# Patient Record
Sex: Female | Born: 1975 | Race: White | Hispanic: No | Marital: Single
Health system: Southern US, Community
[De-identification: ages and names within clinical notes are randomized; demographics above are authoritative.]

---

## 1997-11-04 ENCOUNTER — Encounter: Admission: RE | Admit: 1997-11-04 | Discharge: 1998-02-02 | Payer: Self-pay

## 2011-04-20 ENCOUNTER — Other Ambulatory Visit: Payer: Self-pay

## 2011-04-20 ENCOUNTER — Other Ambulatory Visit: Payer: Self-pay | Admitting: Family Medicine

## 2011-04-20 DIAGNOSIS — E049 Nontoxic goiter, unspecified: Secondary | ICD-10-CM

## 2011-05-05 ENCOUNTER — Ambulatory Visit
Admission: RE | Admit: 2011-05-05 | Discharge: 2011-05-05 | Disposition: A | Discharge status: Home / Self Care | Payer: BC Managed Care – PPO | Source: Ambulatory Visit | Attending: Family Medicine | Admitting: Family Medicine

## 2011-05-05 ENCOUNTER — Other Ambulatory Visit: Payer: Self-pay | Admitting: Family Medicine

## 2011-05-05 DIAGNOSIS — E049 Nontoxic goiter, unspecified: Secondary | ICD-10-CM

## 2012-09-13 IMAGING — US US SOFT TISSUE HEAD/NECK
1 series · 14 of 25 positions shown · non-contrast
Comparison: None.

CLINICAL DATA: Enlarged thyroid on physical exam

THYROID ULTRASOUND
TECHNIQUE: Ultrasound examination of the thyroid gland and adjacent
soft tissues was performed.

[Series 1: us soft tissue head/neck · 0.05mm/px · 14 of 50 slices shown]
[im 1/50]
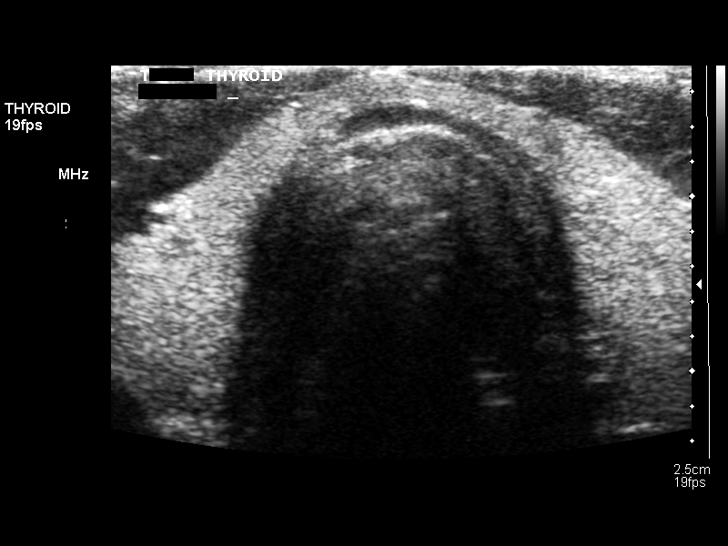
[im 5/50]
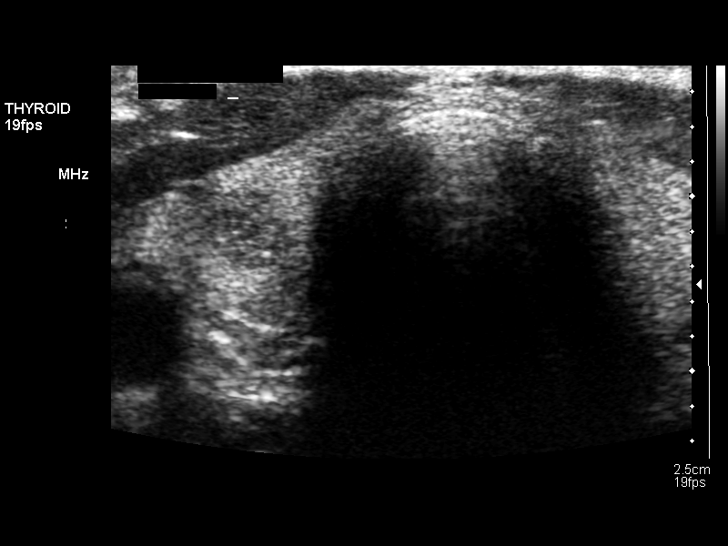
[im 9/50]
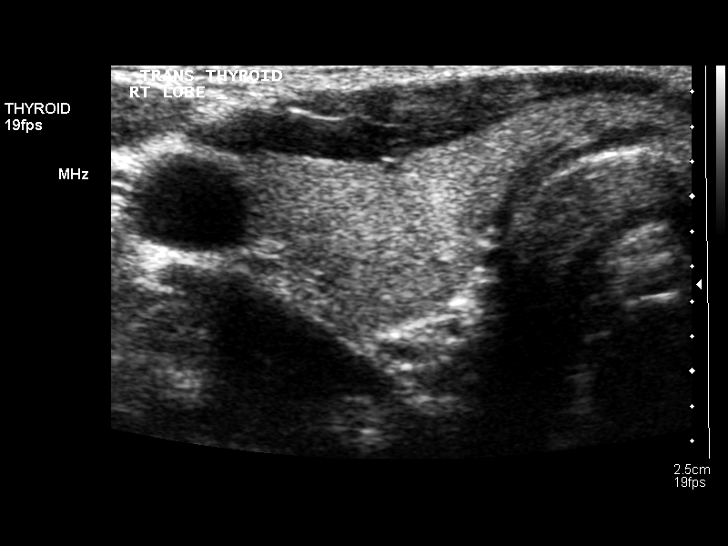
[im 13/50]
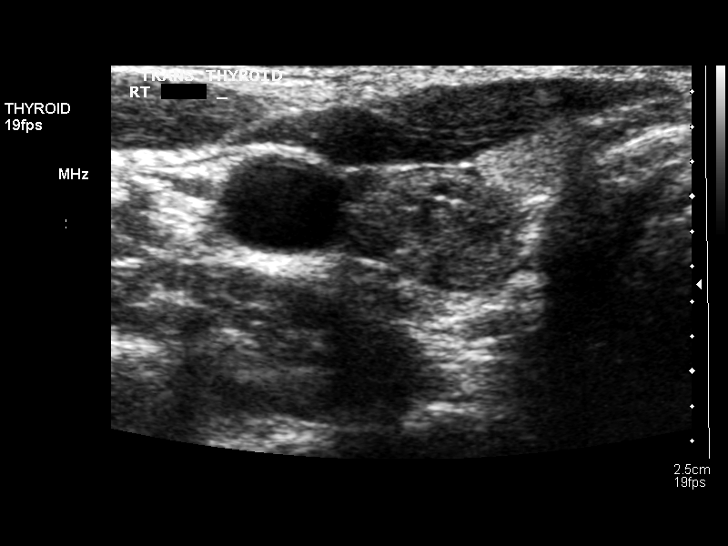
[im 17/50]
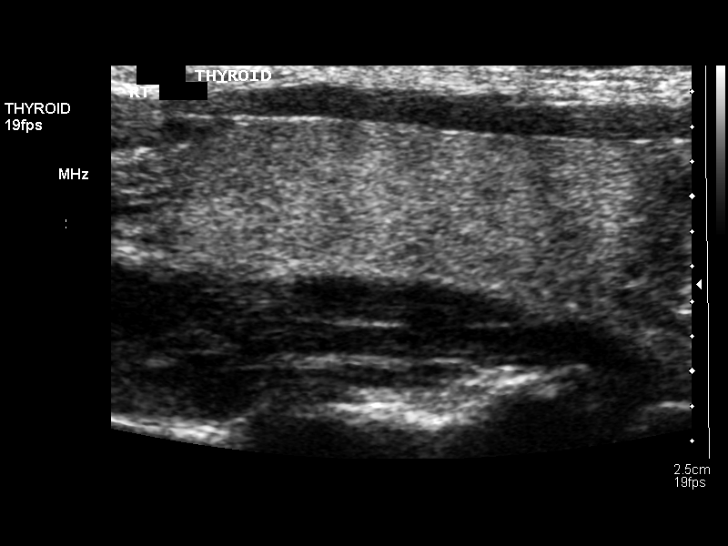
[im 19/50]
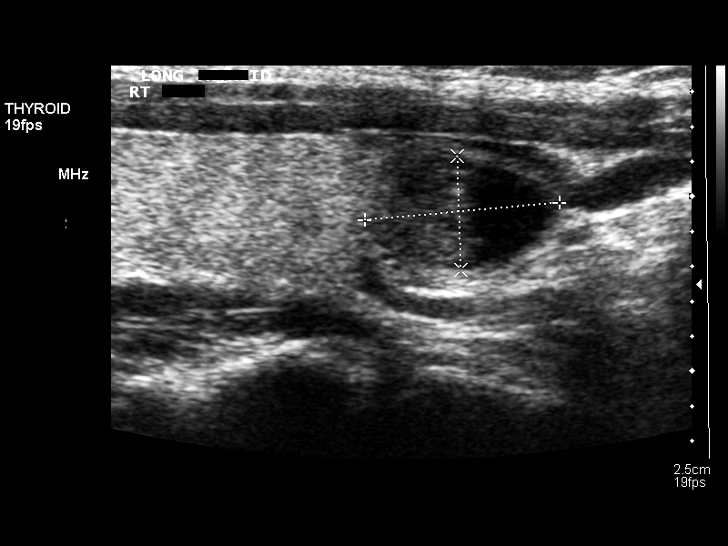
[im 23/50]
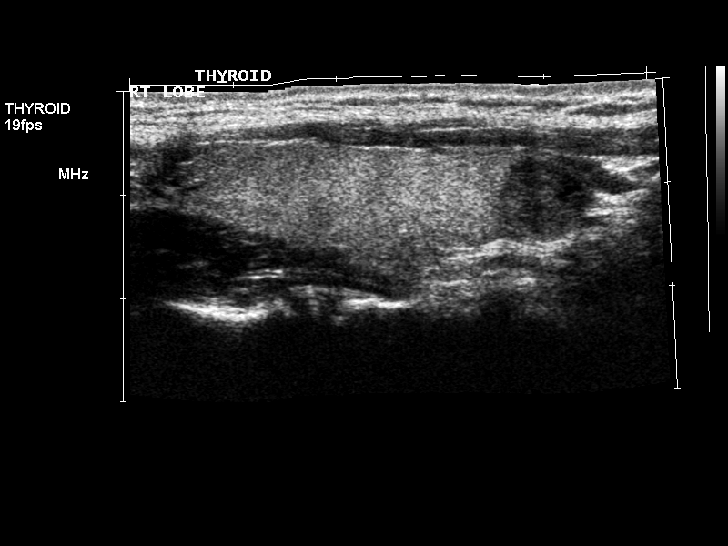
[im 27/50]
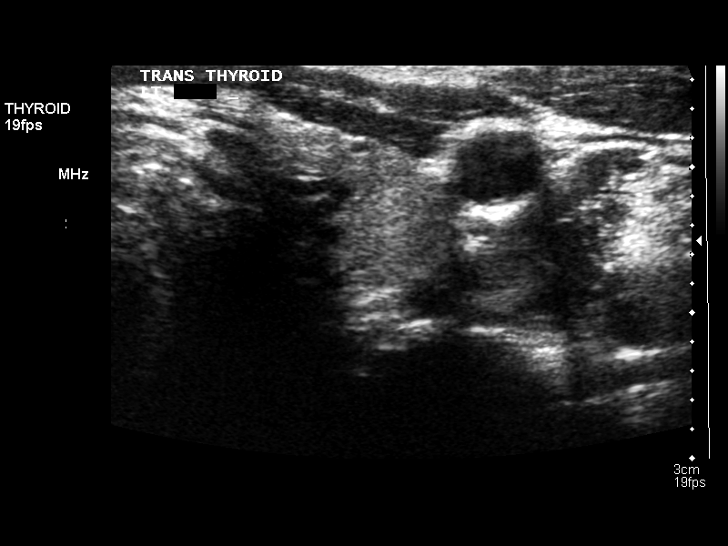
[im 31/50]
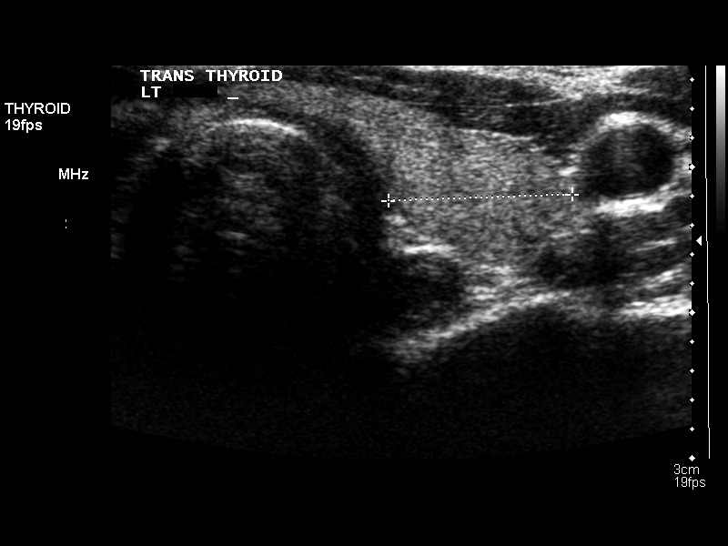
[im 33/50]
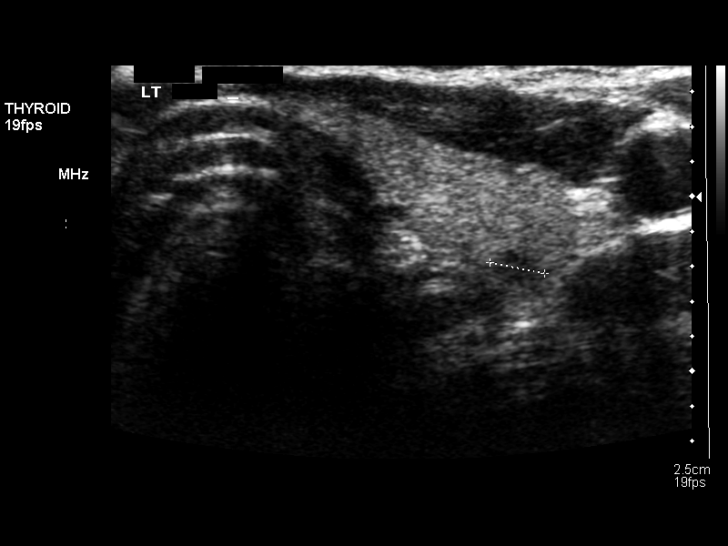
[im 37/50]
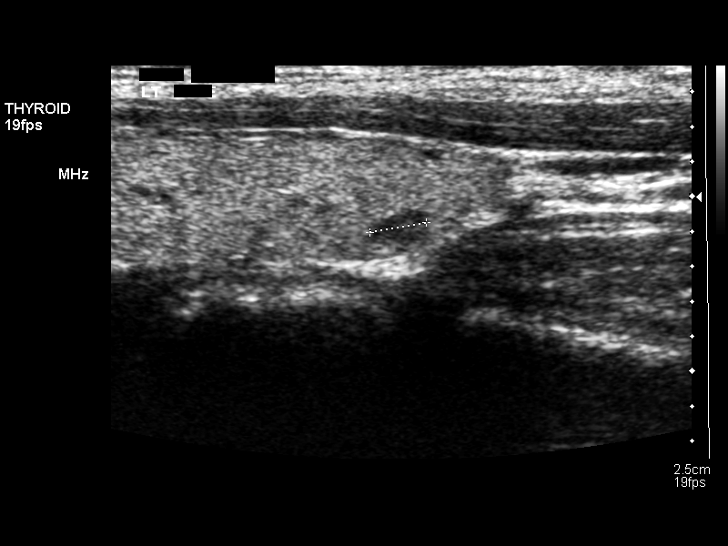
[im 41/50]
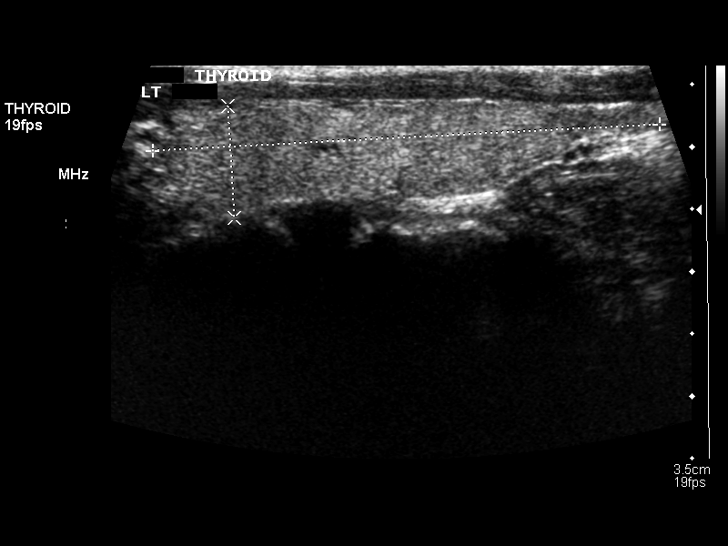
[im 45/50]
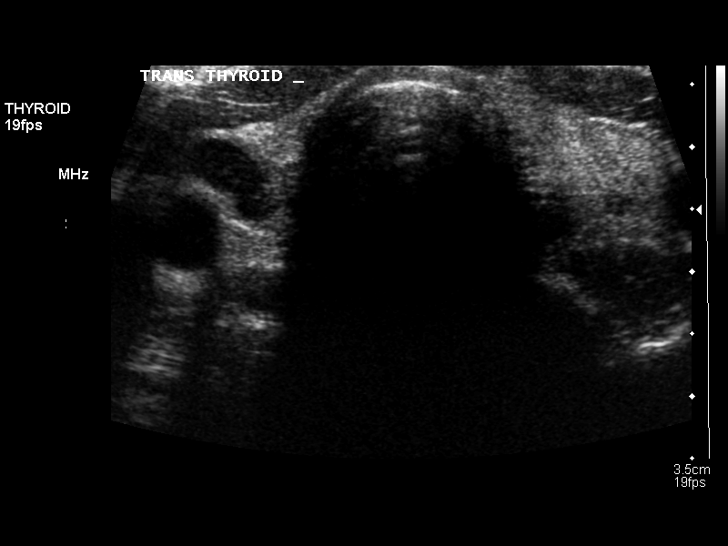
[im 50/50]
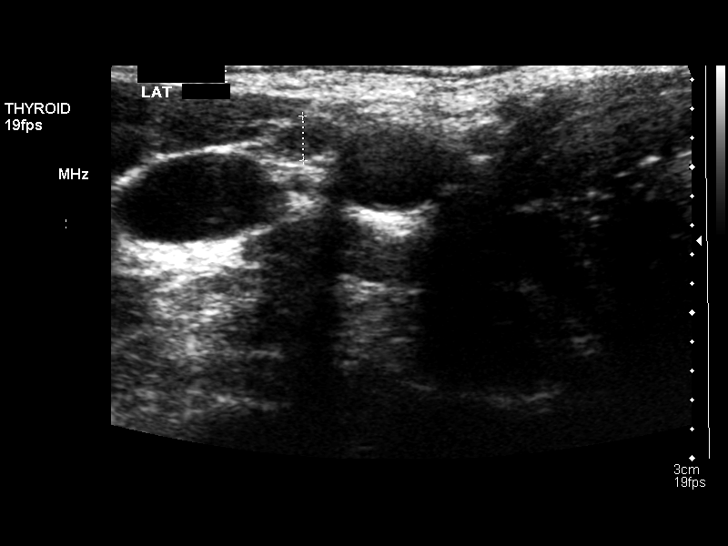

[14 of 25 positions shown; findings below may reference images not displayed]

FINDINGS: Right thyroid lobe:  4.2 x 1.2 x 1.5 cm.
Left thyroid lobe:  4.1 x 0.9 x 1.3 cm.
Isthmus:  2.3 mm in thickness.

Focal nodules:  The echogenicity of the thyroid gland is
homogeneous.  A small cystic and solid nodule is noted in the lower
pole of the right lobe measuring 1.1 x 0.7 x 0.8 cm with
calcification.  A 3-mm nodule is noted in the lower pole of the
left lobe.

Lymphadenopathy:  None visualized.
IMPRESSION: The thyroid gland is normal in size with only small nodules,  the
largest of 11 mm in diameter on the right.

## 2015-06-24 ENCOUNTER — Telehealth: Payer: Self-pay | Admitting: Family Medicine

## 2015-06-24 MED ORDER — ESCITALOPRAM OXALATE 20 MG PO TABS: 20.0000 mg | ORAL_TABLET | Freq: Every day | ORAL | Status: DC

## 2015-06-24 MED ORDER — TRAZODONE HCL 100 MG PO TABS: 100.0000 mg | ORAL_TABLET | Freq: Every day | ORAL | Status: DC

## 2015-06-24 NOTE — Telephone Encounter (Signed)
Can this be approved? There is no documentation of meds in her chart

## 2015-09-03 ENCOUNTER — Other Ambulatory Visit: Payer: Self-pay | Admitting: Family Medicine

## 2015-10-22 ENCOUNTER — Telehealth: Payer: Self-pay | Admitting: Family Medicine

## 2015-10-22 NOTE — Telephone Encounter (Signed)
Please call patient and find out what her complaints are in if she is running a fever and specifically what her symptoms are.

## 2015-10-22 NOTE — Telephone Encounter (Signed)
Symptoms :  achey Fatigue Headache- really bad No fever that she is aware of No congestion or cough  X 3 days  No tick bite Her concern is about coming in to town to be with her mom She is doing chemo and she wonders if she can be around her  i told her it would probably be a good idea to keep her distance until she feels better i also mentioned that she could come in here to Saturday clinic if she still felt bad  She wants your opinion.

## 2015-10-22 NOTE — Telephone Encounter (Signed)
I agree with your comments. Rest fluids and keep her distance from her mother as even Pam would be susceptible to viral type illnesses. She may even want to delay her visit for a week until she recuperates completely.

## 2015-10-22 NOTE — Telephone Encounter (Signed)
Pt aware.

## 2016-01-03 ENCOUNTER — Other Ambulatory Visit: Payer: Self-pay | Admitting: Family Medicine
